# Patient Record
Sex: Female | Born: 1990 | Hispanic: No | Marital: Single | Smoking: Current every day smoker
Health system: Southern US, Community
[De-identification: ages and names within clinical notes are randomized; demographics above are authoritative.]

## PROBLEM LIST (undated history)

## (undated) ENCOUNTER — Emergency Department
Admission: EM | Disposition: A | Discharge status: Still In Hospital | Payer: No Typology Code available for payment source

## (undated) ENCOUNTER — Emergency Department: Admission: EM | Discharge status: Absent without leave | Payer: No Typology Code available for payment source

---
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
## 2013-03-27 ENCOUNTER — Encounter (HOSPITAL_COMMUNITY): Payer: Self-pay | Admitting: Cardiology

## 2013-03-27 ENCOUNTER — Emergency Department (HOSPITAL_COMMUNITY): Payer: Self-pay

## 2013-03-27 ENCOUNTER — Emergency Department (HOSPITAL_COMMUNITY)
Admission: EM | Admit: 2013-03-27 | Discharge: 2013-03-28 | Disposition: A | Discharge status: Home / Self Care | Payer: Self-pay | Attending: Emergency Medicine | Admitting: Emergency Medicine

## 2013-03-27 DIAGNOSIS — Z79899 Other long term (current) drug therapy: Secondary | ICD-10-CM | POA: Insufficient documentation

## 2013-03-27 DIAGNOSIS — K8020 Calculus of gallbladder without cholecystitis without obstruction: Secondary | ICD-10-CM | POA: Insufficient documentation

## 2013-03-27 DIAGNOSIS — R1012 Left upper quadrant pain: Secondary | ICD-10-CM | POA: Insufficient documentation

## 2013-03-27 DIAGNOSIS — Z3202 Encounter for pregnancy test, result negative: Secondary | ICD-10-CM | POA: Insufficient documentation

## 2013-03-27 DIAGNOSIS — R339 Retention of urine, unspecified: Secondary | ICD-10-CM | POA: Insufficient documentation

## 2013-03-27 DIAGNOSIS — R1011 Right upper quadrant pain: Secondary | ICD-10-CM | POA: Insufficient documentation

## 2013-03-27 DIAGNOSIS — R109 Unspecified abdominal pain: Secondary | ICD-10-CM

## 2013-03-27 DIAGNOSIS — F17200 Nicotine dependence, unspecified, uncomplicated: Secondary | ICD-10-CM | POA: Insufficient documentation

## 2013-03-27 LAB — URINALYSIS, ROUTINE W REFLEX MICROSCOPIC
Glucose, UA: NEGATIVE mg/dL
Ketones, ur: NEGATIVE mg/dL
Leukocytes, UA: NEGATIVE
Nitrite: NEGATIVE
Protein, ur: NEGATIVE mg/dL
pH: 7.5 (ref 5.0–8.0)

## 2013-03-27 LAB — CBC WITH DIFFERENTIAL/PLATELET
Eosinophils Relative: 2 % (ref 0–5)
Hemoglobin: 13 g/dL (ref 12.0–15.0)
Lymphs Abs: 1.7 10*3/uL (ref 0.7–4.0)
MCH: 28.6 pg (ref 26.0–34.0)
MCHC: 33.1 g/dL (ref 30.0–36.0)
MCV: 86.4 fL (ref 78.0–100.0)
Neutrophils Relative %: 76 % (ref 43–77)
RBC: 4.55 MIL/uL (ref 3.87–5.11)
RDW: 13.2 % (ref 11.5–15.5)
WBC: 11.9 10*3/uL — ABNORMAL HIGH (ref 4.0–10.5)

## 2013-03-27 LAB — COMPREHENSIVE METABOLIC PANEL
AST: 163 U/L — ABNORMAL HIGH (ref 0–37)
Albumin: 3.9 g/dL (ref 3.5–5.2)
BUN: 9 mg/dL (ref 6–23)
Creatinine, Ser: 0.75 mg/dL (ref 0.50–1.10)
GFR calc non Af Amer: >90 mL/min (ref >90)
Glucose, Bld: 98 mg/dL (ref 70–99)

## 2013-03-27 LAB — POCT PREGNANCY, URINE: Preg Test, Ur: NEGATIVE

## 2013-03-27 NOTE — ED Provider Notes (Addendum)
CSN: [id number]     Arrival date & time 03/27/13  1635 History   First MD Initiated Contact with Patient 03/27/13 1935     Chief Complaint  Patient presents with  . Abdominal Pain   (Consider location/radiation/quality/duration/timing/severity/associated sxs/prior Treatment) Patient is a 22 y.o. female presenting with abdominal pain. The history is provided by the patient.  Abdominal Pain Pain location:  LUQ and RUQ Pain quality: aching, bloating and dull   Pain quality: not cramping, not heavy and no pressure   Pain radiates to:  Does not radiate Pain severity:  Moderate Onset quality:  Gradual Duration:  2 days Timing:  Constant Progression:  Worsening Chronicity:  New Context: alcohol use   Context comment:  Remote history of excessive alcohol use. Patient stopped drinking heavily approximately 2 months ago Relieved by:  Nothing Worsened by:  Nothing tried Ineffective treatments:  Acetaminophen Associated symptoms: no anorexia, no belching, no chest pain, no chills, no constipation, no cough, no diarrhea, no dysuria, no hematuria, no nausea, no sore throat, no vaginal bleeding and no vaginal discharge   Risk factors: alcohol abuse   Risk factors comment:  Alcohol abuse in the past, minimal drinking over past 2 months   History reviewed. No pertinent past medical history. History reviewed. No pertinent past surgical history. History reviewed. No pertinent family history. History  Substance Use Topics  . Smoking status: Current Every Day Smoker    Types: Cigarettes  . Smokeless tobacco: Not on file  . Alcohol Use: Yes   OB History   Grav Para Term Preterm Abortions TAB SAB Ect Mult Living                 Review of Systems  Constitutional: Negative for chills, diaphoresis, activity change and appetite change.  HENT: Negative.  Negative for sore throat.   Eyes: Negative.   Respiratory: Negative.  Negative for cough.   Cardiovascular: Negative for chest pain.   Gastrointestinal: Positive for abdominal pain. Negative for nausea, diarrhea, constipation, blood in stool, abdominal distention, anal bleeding and anorexia.  Endocrine: Negative.   Genitourinary: Positive for decreased urine volume. Negative for dysuria, hematuria, vaginal bleeding, vaginal discharge, difficulty urinating, menstrual problem and dyspareunia.  Musculoskeletal: Negative.   Neurological: Negative.   Psychiatric/Behavioral: Negative.     Allergies  Review of patient's allergies indicates no known allergies.  Home Medications   Current Outpatient Rx  Name  Route  Sig  Dispense  Refill  . naproxen sodium (ANAPROX) 220 MG tablet   Oral   Take 220 mg by mouth 2 (two) times daily with a meal.          BP 104/66  Pulse 58  Temp(Src) 98.1 F (36.7 C) (Oral)  Resp 18  SpO2 99%  LMP 02/25/2013 Physical Exam  Constitutional: She is oriented to person, place, and time. She appears well-developed and well-nourished.  HENT:  Head: Normocephalic and atraumatic.  Eyes: Conjunctivae and EOM are normal. Pupils are equal, round, and reactive to light.  Neck: Normal range of motion.  Cardiovascular: Normal rate and regular rhythm.  Exam reveals no gallop and no friction rub.   No murmur heard. Pulmonary/Chest: Effort normal.  Abdominal: Soft. Bowel sounds are normal. She exhibits no distension and no mass. There is tenderness. There is no rebound and no guarding.  Genitourinary:  Patient denies lower abdominal pain, vaginal symptoms, declines pelvic exam.  Musculoskeletal: Normal range of motion.  Neurological: She is alert and oriented to person, place, and  time. She has normal reflexes.  Skin: Skin is warm and dry.    ED Course  Procedures (including critical care time) Labs Review Labs Reviewed  CBC WITH DIFFERENTIAL - Abnormal; Notable for the following:    WBC 11.9 (*)    Neutro Abs 9.0 (*)    All other components within normal limits  COMPREHENSIVE METABOLIC  PANEL - Abnormal; Notable for the following:    AST 163 (*)    All other components within normal limits  LIPASE, BLOOD  URINALYSIS, ROUTINE W REFLEX MICROSCOPIC  POCT PREGNANCY, URINE   Results for orders placed during the hospital encounter of 03/27/13  CBC WITH DIFFERENTIAL      Result Value Range   WBC 11.9 (*) 4.0 - 10.5 K/uL   RBC 4.55  3.87 - 5.11 MIL/uL   Hemoglobin 13.0  12.0 - 15.0 g/dL   HCT 16.1  09.6 - 04.5 %   MCV 86.4  78.0 - 100.0 fL   MCH 28.6  26.0 - 34.0 pg   MCHC 33.1  30.0 - 36.0 g/dL   RDW 40.9  81.1 - 91.4 %   Platelets 274  150 - 400 K/uL   Neutrophils Relative % 76  43 - 77 %   Neutro Abs 9.0 (*) 1.7 - 7.7 K/uL   Lymphocytes Relative 14  12 - 46 %   Lymphs Abs 1.7  0.7 - 4.0 K/uL   Monocytes Relative 8  3 - 12 %   Monocytes Absolute 1.0  0.1 - 1.0 K/uL   Eosinophils Relative 2  0 - 5 %   Eosinophils Absolute 0.2  0.0 - 0.7 K/uL   Basophils Relative 0  0 - 1 %   Basophils Absolute 0.0  0.0 - 0.1 K/uL  COMPREHENSIVE METABOLIC PANEL      Result Value Range   Sodium 139  135 - 145 mEq/L   Potassium 3.5  3.5 - 5.1 mEq/L   Chloride 102  96 - 112 mEq/L   CO2 26  19 - 32 mEq/L   Glucose, Bld 98  70 - 99 mg/dL   BUN 9  6 - 23 mg/dL   Creatinine, Ser 7.82  0.50 - 1.10 mg/dL   Calcium 9.1  8.4 - 95.6 mg/dL   Total Protein 7.6  6.0 - 8.3 g/dL   Albumin 3.9  3.5 - 5.2 g/dL   AST 213 (*) 0 - 37 U/L   ALT 35  0 - 35 U/L   Alkaline Phosphatase 99  39 - 117 U/L   Total Bilirubin 0.6  0.3 - 1.2 mg/dL   GFR calc non Af Amer >90  >90 mL/min   GFR calc Af Amer >90  >90 mL/min  LIPASE, BLOOD      Result Value Range   Lipase 27  11 - 59 U/L  URINALYSIS, ROUTINE W REFLEX MICROSCOPIC      Result Value Range   Color, Urine YELLOW  YELLOW   APPearance CLEAR  CLEAR   Specific Gravity, Urine 1.008  1.005 - 1.030   pH 7.5  5.0 - 8.0   Glucose, UA NEGATIVE  NEGATIVE mg/dL   Hgb urine dipstick NEGATIVE  NEGATIVE   Bilirubin Urine NEGATIVE  NEGATIVE   Ketones, ur  NEGATIVE  NEGATIVE mg/dL   Protein, ur NEGATIVE  NEGATIVE mg/dL   Urobilinogen, UA 1.0  0.0 - 1.0 mg/dL   Nitrite NEGATIVE  NEGATIVE   Leukocytes, UA NEGATIVE  NEGATIVE  POCT PREGNANCY, URINE  Result Value Range   Preg Test, Ur NEGATIVE  NEGATIVE     Imaging Review US Abdomen Complete  03/27/2013   *RADIOLOGY REPORT*  Clinical Data:  Abdominal right upper quadrant pain and increased liver function test.  COMPLETE ABDOMINAL ULTRASOUND  Comparison:  None.  Findings:  Gallbladder:  Numerous large, mobile gallstones.  No wall thickening, sonographic Murphy's sign, or pericholecystic edema.  Common bile duct:  Measures 5 mm.  Liver:  No focal lesion identified.  Within normal limits in parenchymal echogenicity.  IVC:  Appears normal.  Pancreas:  No focal abnormality seen.  Spleen:  Measures 6 cm.  Right Kidney:  Measures 9.2 cm.  No hydronephrosis or focal abnormality.  Left Kidney:  Measures 9.9 cm.  No hydronephrosis or focal abnormality.  Abdominal aorta:  Negative for aneurysm.  Maximal diameter 1.8 cm.  IMPRESSION: Cholelithiasis without evidence of acute cholecystitis.   Original Report Authenticated By: Tiburcio Pea    MDM  No diagnosis found. 22 year old female presents with bilateral upper quadrant abdominal pain, nausea, elevated AST. We'll obtain a quadrant ultrasound to assess for hepatic disease and/or cholecystitis. VSS, AF in nad.    12:06 AM BP 104/66  Pulse 58  Temp(Src) 98.1 F (36.7 C) (Oral)  Resp 18  SpO2 99%  LMP 02/25/2013 Vital signs normal, patient afebrile, abdomen nonsurgical, ultrasound consistent with cholelithiasis without evidence of cholecystitis. White blood cell count malleoli 11.9 however no evidence of serious bacterial illness, surgical abdomen. Plan to discharge with pain medications and patient will followup as outpatient. Emergency department return precautions were provided for her fever, by mouth intolerance, worsening pain, or other  concerns.    Darlys Gales, MD 03/27/13 2119  Darlys Gales, MD 03/28/13 [phone]

## 2013-03-27 NOTE — ED Notes (Signed)
Pt reports lower abd pain that started about 30 minutes ago. Denies any n/v or urinary symptoms with the pain. States that she feels like she has been punched in the stomach.

## 2013-03-27 NOTE — ED Notes (Signed)
Patient back from [facility]

## 2013-03-28 MED ORDER — ONDANSETRON HCL 4 MG PO TABS: 4.0000 mg | ORAL_TABLET | Freq: Four times a day (QID) | ORAL | Status: DC

## 2013-03-28 MED ORDER — DICYCLOMINE HCL 20 MG PO TABS: 20.0000 mg | ORAL_TABLET | Freq: Two times a day (BID) | ORAL | Status: DC

## 2013-12-14 ENCOUNTER — Encounter (HOSPITAL_COMMUNITY): Payer: Self-pay | Admitting: Emergency Medicine

## 2013-12-14 ENCOUNTER — Emergency Department (HOSPITAL_COMMUNITY)
Admission: EM | Admit: 2013-12-14 | Discharge: 2013-12-14 | Disposition: A | Discharge status: Home / Self Care | Payer: No Typology Code available for payment source | Attending: Emergency Medicine | Admitting: Emergency Medicine

## 2013-12-14 ENCOUNTER — Emergency Department (HOSPITAL_COMMUNITY): Payer: No Typology Code available for payment source

## 2013-12-14 DIAGNOSIS — Z791 Long term (current) use of non-steroidal anti-inflammatories (NSAID): Secondary | ICD-10-CM | POA: Insufficient documentation

## 2013-12-14 DIAGNOSIS — S298XXA Other specified injuries of thorax, initial encounter: Secondary | ICD-10-CM | POA: Insufficient documentation

## 2013-12-14 DIAGNOSIS — S6990XA Unspecified injury of unspecified wrist, hand and finger(s), initial encounter: Secondary | ICD-10-CM

## 2013-12-14 DIAGNOSIS — Y9389 Activity, other specified: Secondary | ICD-10-CM | POA: Insufficient documentation

## 2013-12-14 DIAGNOSIS — Y92410 Unspecified street and highway as the place of occurrence of the external cause: Secondary | ICD-10-CM | POA: Insufficient documentation

## 2013-12-14 DIAGNOSIS — M25531 Pain in right wrist: Secondary | ICD-10-CM

## 2013-12-14 DIAGNOSIS — IMO0002 Reserved for concepts with insufficient information to code with codable children: Secondary | ICD-10-CM | POA: Insufficient documentation

## 2013-12-14 DIAGNOSIS — S59919A Unspecified injury of unspecified forearm, initial encounter: Secondary | ICD-10-CM

## 2013-12-14 DIAGNOSIS — R079 Chest pain, unspecified: Secondary | ICD-10-CM

## 2013-12-14 DIAGNOSIS — F17200 Nicotine dependence, unspecified, uncomplicated: Secondary | ICD-10-CM | POA: Insufficient documentation

## 2013-12-14 DIAGNOSIS — Z79899 Other long term (current) drug therapy: Secondary | ICD-10-CM | POA: Insufficient documentation

## 2013-12-14 DIAGNOSIS — S60219A Contusion of unspecified wrist, initial encounter: Secondary | ICD-10-CM | POA: Insufficient documentation

## 2013-12-14 DIAGNOSIS — S20219A Contusion of unspecified front wall of thorax, initial encounter: Secondary | ICD-10-CM | POA: Insufficient documentation

## 2013-12-14 DIAGNOSIS — S59909A Unspecified injury of unspecified elbow, initial encounter: Secondary | ICD-10-CM | POA: Insufficient documentation

## 2013-12-14 MED ORDER — OXYCODONE-ACETAMINOPHEN 5-325 MG PO TABS: 1.0000 | ORAL_TABLET | ORAL | Status: DC | PRN

## 2013-12-14 MED ORDER — IBUPROFEN 800 MG PO TABS: 800.0000 mg | ORAL_TABLET | Freq: Three times a day (TID) | ORAL | Status: DC

## 2013-12-14 MED ORDER — OXYCODONE-ACETAMINOPHEN 5-325 MG PO TABS
2.0000 | ORAL_TABLET | Freq: Once | ORAL | Status: AC
  Administered 2013-12-14: 2 via ORAL
  Filled 2013-12-14: qty 2
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
## 2013-12-14 NOTE — Discharge Instructions (Signed)
Take the prescribed medication as directed for pain.  May also wish to ice and elevate wrist to help with pain/swelling. You will likely continue to be sore for the next several days but it should gradually start improving over time. Return to the ED for new or worsening symptoms.

## 2013-12-15 NOTE — ED Provider Notes (Signed)
Medical screening examination/treatment/procedure(s) were performed by non-physician practitioner and as supervising physician I was immediately available for consultation/collaboration.   EKG Interpretation None        Delmon Andrada M Coley Littles, MD 12/15/13 0017 

## 2017-08-02 ENCOUNTER — Ambulatory Visit
Admission: RE | Admit: 2017-08-02 | Discharge: 2017-08-02 | Disposition: A | Discharge status: Home / Self Care | Payer: Self-pay | Attending: Family Medicine | Admitting: Family Medicine
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
## 2018-02-16 ENCOUNTER — Encounter (HOSPITAL_COMMUNITY): Payer: Self-pay | Admitting: Emergency Medicine

## 2018-02-16 ENCOUNTER — Emergency Department (HOSPITAL_COMMUNITY)
Admission: EM | Admit: 2018-02-16 | Discharge: 2018-02-16 | Discharge status: Against Medical Advice | Payer: Self-pay | Attending: Emergency Medicine | Admitting: Emergency Medicine

## 2018-02-16 DIAGNOSIS — T40601A Poisoning by unspecified narcotics, accidental (unintentional), initial encounter: Secondary | ICD-10-CM | POA: Insufficient documentation

## 2018-02-16 DIAGNOSIS — R404 Transient alteration of awareness: Secondary | ICD-10-CM | POA: Insufficient documentation

## 2018-02-16 DIAGNOSIS — F17210 Nicotine dependence, cigarettes, uncomplicated: Secondary | ICD-10-CM | POA: Insufficient documentation

## 2018-02-16 DIAGNOSIS — Z79899 Other long term (current) drug therapy: Secondary | ICD-10-CM | POA: Insufficient documentation

## 2018-02-16 LAB — CBC WITH DIFFERENTIAL/PLATELET
Abs Immature Granulocytes: 0 10*3/uL (ref 0.0–0.1)
BASOS PCT: 1 %
Basophils Absolute: 0.1 10*3/uL (ref 0.0–0.1)
EOS ABS: 0.6 10*3/uL (ref 0.0–0.7)
EOS PCT: 6 %
HCT: 38.1 % (ref 36.0–46.0)
HEMOGLOBIN: 11.7 g/dL — AB (ref 12.0–15.0)
Immature Granulocytes: 0 %
LYMPHS PCT: 37 %
Lymphs Abs: 3.3 10*3/uL (ref 0.7–4.0)
MCH: 25.8 pg — AB (ref 26.0–34.0)
MCHC: 30.7 g/dL (ref 30.0–36.0)
MCV: 84.1 fL (ref 78.0–100.0)
MONO ABS: 0.9 10*3/uL (ref 0.1–1.0)
Monocytes Relative: 10 %
Neutro Abs: 4 10*3/uL (ref 1.7–7.7)
Neutrophils Relative %: 46 %
PLATELETS: 328 10*3/uL (ref 150–400)
RBC: 4.53 MIL/uL (ref 3.87–5.11)
RDW: 17.2 % — AB (ref 11.5–15.5)
WBC: 8.9 10*3/uL (ref 4.0–10.5)

## 2018-02-16 LAB — RAPID URINE DRUG SCREEN, HOSP PERFORMED
AMPHETAMINES: NOT DETECTED
Barbiturates: NOT DETECTED
Benzodiazepines: NOT DETECTED
Cocaine: NOT DETECTED
OPIATES: NOT DETECTED
Tetrahydrocannabinol: NOT DETECTED

## 2018-02-16 LAB — COMPREHENSIVE METABOLIC PANEL
ALK PHOS: 58 U/L (ref 38–126)
ALT: 12 U/L (ref 0–44)
AST: 51 U/L — AB (ref 15–41)
Albumin: 3.9 g/dL (ref 3.5–5.0)
Anion gap: 9 (ref 5–15)
BILIRUBIN TOTAL: 0.6 mg/dL (ref 0.3–1.2)
BUN: 8 mg/dL (ref 6–20)
CALCIUM: 8.5 mg/dL — AB (ref 8.9–10.3)
CO2: 26 mmol/L (ref 22–32)
Chloride: 103 mmol/L (ref 98–111)
Creatinine, Ser: 0.99 mg/dL (ref 0.44–1.00)
GFR calc Af Amer: >60 mL/min (ref >60)
GLUCOSE: 83 mg/dL (ref 70–99)
Potassium: 4.5 mmol/L (ref 3.5–5.1)
Sodium: 138 mmol/L (ref 135–145)
TOTAL PROTEIN: 6.5 g/dL (ref 6.5–8.1)

## 2018-02-16 LAB — I-STAT BETA HCG BLOOD, ED (MC, WL, AP ONLY)

## 2018-02-16 LAB — MAGNESIUM: MAGNESIUM: 2.1 mg/dL (ref 1.7–2.4)

## 2018-02-16 LAB — ACETAMINOPHEN LEVEL

## 2018-02-16 LAB — ETHANOL: ALCOHOL ETHYL (B): 88 mg/dL — AB (ref <10)

## 2018-02-16 LAB — SALICYLATE LEVEL

## 2018-02-16 NOTE — ED Notes (Signed)
Dr. Preston FleetingGlick spoke with pt about risks of leaving AMA, pt verbalized understanding.
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
## 2018-02-16 NOTE — ED Notes (Signed)
Pt A&O x 4 at this time, refused final set of vitals.

## 2018-02-16 NOTE — ED Notes (Signed)
Pt requesting to leave, Dr. Preston FleetingGlick aware.
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
## 2018-02-16 NOTE — Discharge Instructions (Addendum)
You are leaving against medical advice.  You had an overdose of an opioid, and received an antidote for opioid overdose.  However, that antidote does not last for very long.  If the opioid you overdosed on it is still in your system when the antidote wears off, you can stop breathing.  This can cause death or irreversible brain damage.  If you change your mind about staying in the emergency department, you are welcome to return at any time.

## 2018-07-12 ENCOUNTER — Emergency Department (HOSPITAL_COMMUNITY): Payer: Self-pay

## 2018-07-12 ENCOUNTER — Inpatient Hospital Stay (HOSPITAL_COMMUNITY)
Admission: EM | Admit: 2018-07-12 | Discharge: 2018-07-26 | DRG: 091 | Disposition: E | Payer: Self-pay | Attending: Internal Medicine | Admitting: Internal Medicine

## 2018-07-12 DIAGNOSIS — I469 Cardiac arrest, cause unspecified: Secondary | ICD-10-CM

## 2018-07-12 DIAGNOSIS — R578 Other shock: Secondary | ICD-10-CM | POA: Diagnosis present

## 2018-07-12 DIAGNOSIS — F17210 Nicotine dependence, cigarettes, uncomplicated: Secondary | ICD-10-CM | POA: Diagnosis present

## 2018-07-12 DIAGNOSIS — K7200 Acute and subacute hepatic failure without coma: Secondary | ICD-10-CM | POA: Diagnosis present

## 2018-07-12 DIAGNOSIS — T40601A Poisoning by unspecified narcotics, accidental (unintentional), initial encounter: Secondary | ICD-10-CM | POA: Diagnosis present

## 2018-07-12 DIAGNOSIS — E875 Hyperkalemia: Secondary | ICD-10-CM | POA: Diagnosis present

## 2018-07-12 DIAGNOSIS — Z79891 Long term (current) use of opiate analgesic: Secondary | ICD-10-CM

## 2018-07-12 DIAGNOSIS — E872 Acidosis: Secondary | ICD-10-CM | POA: Diagnosis present

## 2018-07-12 DIAGNOSIS — Z791 Long term (current) use of non-steroidal anti-inflammatories (NSAID): Secondary | ICD-10-CM

## 2018-07-12 DIAGNOSIS — J9600 Acute respiratory failure, unspecified whether with hypoxia or hypercapnia: Secondary | ICD-10-CM

## 2018-07-12 DIAGNOSIS — G931 Anoxic brain damage, not elsewhere classified: Principal | ICD-10-CM

## 2018-07-12 LAB — CBC WITH DIFFERENTIAL/PLATELET
Abs Immature Granulocytes: 1.2 10*3/uL — ABNORMAL HIGH (ref 0.00–0.07)
BASOS ABS: 0.1 10*3/uL (ref 0.0–0.1)
BASOS PCT: 1 %
Band Neutrophils: 1 %
EOS ABS: 0.3 10*3/uL (ref 0.0–0.5)
EOS PCT: 2 %
HCT: 45.4 % (ref 36.0–46.0)
HEMOGLOBIN: 12.3 g/dL (ref 12.0–15.0)
Lymphocytes Relative: 54 %
Lymphs Abs: 7 10*3/uL — ABNORMAL HIGH (ref 0.7–4.0)
MCH: 27.6 pg (ref 26.0–34.0)
MCHC: 27.1 g/dL — AB (ref 30.0–36.0)
MCV: 102 fL — AB (ref 80.0–100.0)
Metamyelocytes Relative: 4 %
Monocytes Absolute: 0.8 10*3/uL (ref 0.1–1.0)
Monocytes Relative: 6 %
Myelocytes: 5 %
NEUTROS ABS: 3.6 10*3/uL (ref 1.7–7.7)
NEUTROS PCT: 27 %
NRBC: 0.4 % — AB (ref 0.0–0.2)
Platelets: 175 10*3/uL (ref 150–400)
RBC: 4.45 MIL/uL (ref 3.87–5.11)
RDW: 16.7 % — ABNORMAL HIGH (ref 11.5–15.5)
WBC: 13 10*3/uL — ABNORMAL HIGH (ref 4.0–10.5)
nRBC: 1 /100 WBC — ABNORMAL HIGH

## 2018-07-12 LAB — COMPREHENSIVE METABOLIC PANEL
ALBUMIN: 2.8 g/dL — AB (ref 3.5–5.0)
ALK PHOS: 92 U/L (ref 38–126)
ALT: 390 U/L — ABNORMAL HIGH (ref 0–44)
ANION GAP: 26 — AB (ref 5–15)
AST: 877 U/L — ABNORMAL HIGH (ref 15–41)
BUN: 15 mg/dL (ref 6–20)
CALCIUM: 8.4 mg/dL — AB (ref 8.9–10.3)
CO2: 7 mmol/L — ABNORMAL LOW (ref 22–32)
Chloride: 105 mmol/L (ref 98–111)
Creatinine, Ser: 1.87 mg/dL — ABNORMAL HIGH (ref 0.44–1.00)
GFR calc Af Amer: 42 mL/min — ABNORMAL LOW (ref >60)
GFR calc non Af Amer: 36 mL/min — ABNORMAL LOW (ref >60)
GLUCOSE: 541 mg/dL — AB (ref 70–99)
Potassium: 5.2 mmol/L — ABNORMAL HIGH (ref 3.5–5.1)
Sodium: 138 mmol/L (ref 135–145)
TOTAL PROTEIN: 5.4 g/dL — AB (ref 6.5–8.1)
Total Bilirubin: 0.7 mg/dL (ref 0.3–1.2)

## 2018-07-12 LAB — I-STAT ARTERIAL BLOOD GAS, ED
Acid-base deficit: 9 mmol/L — ABNORMAL HIGH (ref 0.0–2.0)
Bicarbonate: 21.1 mmol/L (ref 20.0–28.0)
O2 Saturation: 70 %
PCO2 ART: 56.7 mmHg — AB (ref 32.0–48.0)
PO2 ART: 36 mmHg — AB (ref 83.0–108.0)
Patient temperature: 89.9
TCO2: 23 mmol/L (ref 22–32)
pH, Arterial: 7.149 — CL (ref 7.350–7.450)

## 2018-07-12 LAB — MAGNESIUM: Magnesium: 3.5 mg/dL — ABNORMAL HIGH (ref 1.7–2.4)

## 2018-07-12 LAB — TROPONIN I: Troponin I: 0.29 ng/mL (ref <0.03)

## 2018-07-12 MED ORDER — NOREPINEPHRINE 4 MG/250ML-% IV SOLN
INTRAVENOUS | Status: AC
  Administered 2018-07-12: 4 mg
  Filled 2018-07-12: qty 250

## 2018-07-12 MED ORDER — ATROPINE SULFATE 1 MG/ML IJ SOLN
INTRAMUSCULAR | Status: AC | PRN
  Administered 2018-07-12: 1 mg via INTRAVENOUS

## 2018-07-12 MED ORDER — NOREPINEPHRINE 4 MG/250ML-% IV SOLN
0.0000 ug/min | INTRAVENOUS | Status: DC
Start: 2018-07-12 — End: 2018-07-12
  Filled 2018-07-12: qty 250

## 2018-07-12 MED ORDER — SODIUM BICARBONATE 8.4 % IV SOLN
INTRAVENOUS | Status: AC | PRN
  Administered 2018-07-12: 50 meq via INTRAVENOUS

## 2018-07-12 MED ORDER — SODIUM BICARBONATE 8.4 % IV SOLN
INTRAVENOUS | Status: DC
  Administered 2018-07-12: 09:00:00 via INTRAVENOUS
  Filled 2018-07-12 (×2): qty 150

## 2018-07-12 MED ORDER — HEPARIN SODIUM (PORCINE) 5000 UNIT/ML IJ SOLN: 5000.0000 [IU] | Freq: Three times a day (TID) | INTRAMUSCULAR | Status: DC

## 2018-07-12 MED ORDER — LACTATED RINGERS IV BOLUS
2000.0000 mL | Freq: Once | INTRAVENOUS | Status: AC
  Administered 2018-07-12: 2000 mL via INTRAVENOUS

## 2018-07-12 MED ORDER — STERILE WATER FOR INJECTION IV SOLN
INTRAVENOUS | Status: DC
  Filled 2018-07-12 (×4): qty 850

## 2018-07-12 MED ORDER — PHENYLEPHRINE HCL-NACL 10-0.9 MG/250ML-% IV SOLN
0.0000 ug/min | INTRAVENOUS | Status: DC
  Filled 2018-07-12: qty 250

## 2018-07-12 MED ORDER — EPINEPHRINE PF 1 MG/10ML IJ SOSY
PREFILLED_SYRINGE | INTRAMUSCULAR | Status: AC | PRN
  Administered 2018-07-12: 1 mg via INTRAVENOUS

## 2018-07-12 MED ORDER — SODIUM CHLORIDE 0.9 % IV SOLN
Freq: Once | INTRAVENOUS | Status: AC
  Administered 2018-07-12: 1000 mL via INTRAVENOUS

## 2018-07-12 MED ORDER — PANTOPRAZOLE SODIUM 40 MG IV SOLR: 40.0000 mg | Freq: Every day | INTRAVENOUS | Status: DC

## 2018-07-12 MED ORDER — EPINEPHRINE PF 1 MG/ML IJ SOLN
0.5000 ug/min | INTRAVENOUS | Status: DC
  Administered 2018-07-12: 10 ug/min via INTRAVENOUS
  Filled 2018-07-12: qty 4

## 2018-07-12 MED ORDER — STERILE WATER FOR INJECTION IV SOLN
INTRAVENOUS | Status: DC
  Filled 2018-07-12: qty 850

## 2018-07-12 MED FILL — Medication: Qty: 1 | Status: AC

## 2018-07-17 LAB — CULTURE, BLOOD (ROUTINE X 2)
Culture: NO GROWTH
Special Requests: ADEQUATE

## 2018-07-26 NOTE — ED Provider Notes (Signed)
Emergency Department Provider Note   I have reviewed the triage vital signs and the nursing notes.   HISTORY  Chief Complaint Cardiac Arrest   HPI Emily Flores is a 28 y.o. female who presents the emergency department with active CPR not able to offer any history.  Sounds like from EMS that the patient came home around 2:00 and was seen ambulate without difficulty but then around 430 was found pulseless and in respiratory cardiac arrest.  On fire arrival they gave her Narcan she has a history of opiate overuse but on EMS arrival she was still in asystolic arrest.  Multiple doses of epinephrine given over a 30-minute time span and she got ROSC.  Patient lost pulses again approximately 30 minutes later.  Her end-tidal CO2 was in the 60s but then dropped.  She arrives in active CPR after 2 more doses of epinephrine.   LEVEL V CAVEAT APPLIES SECONDARY TO active CPR   No past medical history on file.  There are no active problems to display for this patient.   No past surgical history on file.  Current Outpatient Rx  . Order #: [id number] Class: Historical Med  . Order #: [id number] Class: Print  . Order #: [id number] Class: Print    Allergies Patient has no known allergies.  No family history on file.  Social History Social History   Tobacco Use  . Smoking status: Current Every Day Smoker    Types: Cigarettes  . Smokeless tobacco: Never Used  Substance Use Topics  . Alcohol use: Yes  . Drug use: Yes    Types: Marijuana    Review of Systems  LEVEL V CAVEAT APPLIES SECONDARY TO active CPR ____________________________________________  PHYSICAL EXAM:  VITAL SIGNS: ED Triage Vitals  Enc Vitals Group     BP 06-09-2018 0646 (!) 0/0     Pulse Rate 06-09-2018 0646 (!) 0     Resp --      Temp --      Temp src --      SpO2 06-09-2018 0646 97 %     Weight 06-09-2018 0721 130 lb (59 kg)     Height 06-09-2018 0721 5\' 6"  (1.676 m)    Constitutional: Well appearing. Well  nourished. Eyes: Conjunctivae are normal. Blown nonreactive Pupils.  Head: Atraumatic. Nose: No congestion/rhinnorhea. Mouth/Throat: Mucous membranes are not able to be assessed.  Oropharynx non-erythematous. Neck: No stridor.  No meningeal signs.   Cardiovascular: No rate, No rhythm.  Respiratory: No respiratory effort.  Lungs crackles, rhonchi Gastrointestinal: Soft. Mild distention.  Musculoskeletal: No lower extremity tenderness nor edema. No gross deformities of extremities. Neurologic:  Not able to assess 2/2 condition.  Skin:   No rash noted. Psych: Not able to assess 2/2 condition.   ____________________________________________   LABS (all labs ordered are listed, but only abnormal results are displayed)  Labs Reviewed  CBC WITH DIFFERENTIAL/PLATELET  COMPREHENSIVE METABOLIC PANEL  LACTIC ACID, PLASMA  LACTIC ACID, PLASMA  TROPONIN I  MAGNESIUM  BLOOD GAS, ARTERIAL   ____________________________________________  EKG   EKG Interpretation  Date/Time:  Wednesday July 12 2018 06:55:11 EST Ventricular Rate:  88 PR Interval:    QRS Duration: 120 QT Interval:  353 QTC Calculation: 428 R Axis:   105 Text Interpretation:  Sinus rhythm Consider left atrial enlargement Nonspecific intraventricular conduction delay Repol abnrm suggests ischemia, diffuse leads worse than previous Confirmed by Marily MemosMesner, Kadajah Kjos [phone]([id number]) on July 26, 2018 7:38:30 AM       EKG Interpretation  Date/Time:  Wednesday 08/08/2018 07:06:22 EST Ventricular Rate:  78 PR Interval:    QRS Duration: 129 QT Interval:  482 QTC Calculation: 550 R Axis:   79 Text Interpretation:  Sinus rhythm Nonspecific intraventricular conduction delay Repol abnrm suggests ischemia, inferior leads improved diffuse changes from most recent Confirmed by Marily Memos [phone]) on 08-08-2018 7:39:48 AM      ____________________________________________  RADIOLOGY  Dg Chest Portable 1 View  Result Date:  2018/08/08 CLINICAL DATA:  Hypoxia EXAM: PORTABLE CHEST 1 VIEW COMPARISON:  Dec 14, 2013 FINDINGS: Endotracheal tube tip is 4.0 cm above the carina. No pneumothorax. There is airspace consolidation throughout much of the right lung. Left lung is clear. Heart size and pulmonary vascularity are normal. No adenopathy. No bone lesions. IMPRESSION: Endotracheal tube as described without pneumothorax. Consolidation throughout much of the right lung. Suspect diffuse pneumonia, although aspiration could present similarly. Left lung clear. Heart size within normal limits. Electronically Signed   By: Bretta Bang III M.D.   On: August 08, 2018 07:26   ____________________________________________   PROCEDURES  Procedure(s) performed:   Procedures     EMERGENCY DEPARTMENT [place] CARDIAC EXAM "Study: Limited Ultrasound of the heart and pericardium"  INDICATIONS:Cardiac arrest Multiple views of the heart and pericardium were obtained in real-time with a multi-frequency probe.  PERFORMED JX:BJYNWG  IMAGES ARCHIVED?: No  FINDINGS: No pericardial effusion and Hyperdynamic contractility  LIMITATIONS:  Body habitus and Emergent procedure  VIEWS USED: Subcostal 4 chamber and Apical 4 chamber   INTERPRETATION: Cardiac activity present and Increased contractility  CPT Code: 95621-30 (limited transthoracic cardiac)  CRITICAL CARE Performed by: Marily Memos Total critical care time: 35 minutes Critical care time was exclusive of separately billable procedures and treating other patients. Critical care was necessary to treat or prevent imminent or life-threatening deterioration. Critical care was time spent personally by me on the following activities: development of treatment plan with patient and/or surrogate as well as nursing, discussions with consultants, evaluation of patient's response to treatment, examination of patient, obtaining history from patient or surrogate, ordering and performing treatments  and interventions, ordering and review of laboratory studies, ordering and review of radiographic studies, pulse oximetry and re-evaluation of patient's condition.  Cardiopulmonary Resuscitation (CPR) Procedure Note Directed/Performed by: Marily Memos I personally directed ancillary staff and/or performed CPR in an effort to regain return of spontaneous circulation and to maintain cardiac, neuro and systemic perfusion.    ____________________________________________   INITIAL IMPRESSION / ASSESSMENT AND PLAN / ED COURSE  Pertinent labs & imaging results that were available during my care of the patient were reviewed by me and considered in my medical decision making (see chart for details).  EKGs with diffuse ischemia likely secondary to cardiac arrest improved with perfusion.  She is very cold in her extremities also likely patient seen associated with long downtime.  She has bilateral blown pupils the suspicion for has an anoxic brain injury.  She probably overdosed with opiates and respiratory depression led to current state. After prolonged resuscitation, ROSC achieved. Plan for PCCM consult for possible organ donation.  Care to Dr. Patria Mane pending their consult and further management.   ____________________________________________  FINAL CLINICAL IMPRESSION(S) / ED DIAGNOSES  Final diagnoses:  Acute respiratory failure, unspecified whether with hypoxia or hypercapnia (HCC)  Anoxic brain injury (HCC)    MEDICATIONS GIVEN DURING THIS VISIT:  Medications  lactated ringers bolus 2,000 mL (has no administration in time range)    NEW OUTPATIENT MEDICATIONS STARTED DURING THIS VISIT:  New Prescriptions   No medications on file    Note:  This document was prepared using Dragon voice recognition software and may include unintentional dictation errors.    Marily Memos, MD 07/14/18 [phone]

## 2018-07-26 NOTE — ED Provider Notes (Signed)
Brain death noted in the ER. Despite efforts to support her HR and BP for organ donation at the request of family, she underwent cardiac death at 1127am.  pts mother notified. Chaplain involved. Emotional support provided. Critical Care team updated. Pt will be a medical examiner case.    Emily Flores, Emily Mcneish, MD 10/25/17 1135

## 2018-07-26 NOTE — Progress Notes (Signed)
ABG results given to Dr Patria Maneampos. New order to increase RR.

## 2018-07-26 NOTE — Progress Notes (Signed)
Assist in transport pt to CT and back to ED on vent. No noted respiratory issues at this time.

## 2018-07-26 NOTE — Progress Notes (Signed)
Responded to on call chaplain referral to continue support to deceased patient's family. Supported family with empathetic listening, ministry of presence ,emotional and spiritual support.   Prayed over and for patient per family request. Facilitated information sharing between staff and family.  Patient is a ME Case. I Spoke directly to patient's mother and assisted her with what to do next.  I gave here Patient placement department  information with instructions on how to proceed.  Nurse Lew DawesKelly Moon  also spoke with patient's mother and reaffirmed information and intrstruction.  Support Staff throughout duration of caring. Staff appears ok. Will follow with staff at later hours.  Venida JarvisWatlington, Kale Rondeau, [place], [facility], Pager [phone]

## 2018-07-26 NOTE — ED Notes (Signed)
Family at bedside. 

## 2018-07-26 NOTE — Progress Notes (Signed)
Responded to page from ED. Patient is critically ill. Mother has been called. Will forward to up-coming chaplain. Provided the ministry of presence.

## 2018-07-26 NOTE — ED Notes (Signed)
Dr. Patria Maneampos at bedside unable to detected Cpr started  0805 am Epip x 1 0807 no pulse cpr started 0809 all efforts unsuccessful unable to feel pulse Dr. Patria Maneampos d/\c'd efforts.  [phone] Family at bedside, spoke with Dr. Patria Maneampos wishes to make patient a donor [phone] Pulse returned Dr. Patria Maneampos at bedside with u/s cardiac activity.  [facility] called # [phone] Molly Maduro(Robert ) Patient CBG [phone] HR 35 very faint pulse Epip x 1 Hco3 x 1 Calcium x 1 Rate v-tachy 0837 amiodarone 150 mg b/p 150/84 hr-92 sat 69% 0843 hr 25 Hco3x1 HOB elevated to 30 degree 0849 hr 35 epip x1  0851 HCO3 x 2 b/p 79/55 hr 74 sat 71

## 2018-07-26 NOTE — ED Notes (Signed)
Bicarb drip increased to 200 cc/hr./CC

## 2018-07-26 NOTE — ED Provider Notes (Addendum)
No corneal reflex. Pupils fixed and dilated. Suspect brain death with nonsurvivable brain injury. Family requesting organ donation. [place] [facility] contacted. Will add pressor support now for organ perfusion. Repeat transient CPR. Developed wide complex tachycardia. Responded to epi, bicarb, calcium. 150mg  amiodarone added once pulses returned. Additional bicarb now. Continued organ support.  CRITICAL CARE Performed by: Azalia BilisKevin Jumana Paccione Total critical care time: 45 minutes (does not include CPR) Critical care time was exclusive of separately billable procedures and treating other patients. Critical care was necessary to treat or prevent imminent or life-threatening deterioration. Critical care was time spent personally by me on the following activities: development of treatment plan with patient and/or surrogate as well as nursing, discussions with consultants, evaluation of patient's response to treatment, examination of patient, obtaining history from patient or surrogate, ordering and performing treatments and interventions, ordering and review of laboratory studies, ordering and review of radiographic studies, pulse oximetry and re-evaluation of patient's condition.    Cardiopulmonary Resuscitation (CPR) Procedure Note Directed/Performed by: Azalia BilisKevin Abbigal Radich I personally directed ancillary staff and/or performed CPR in an effort to regain return of spontaneous circulation and to maintain cardiac, neuro and systemic perfusion.     Azalia Bilisampos, Yue Flanigan, MD 2018/02/01 [phone]    Azalia Bilisampos, Elizabeth Paulsen, MD 07/14/18 [phone]

## 2018-07-26 NOTE — ED Notes (Signed)
Transported to CT 

## 2018-07-26 NOTE — ED Notes (Signed)
Peri care performed before and after cath insertion

## 2018-07-26 NOTE — ED Notes (Signed)
CC team at bedside , speaking with mother,

## 2018-07-26 NOTE — H&P (Signed)
NAMECarel Schnee Flores, MRN:  [id number], DOB:  10-Nov-1990, LOS: 0 ADMISSION DATE:  2018-07-15, CONSULTATION DATE:  12/18 REFERRING MD:  Dr. Patria Mane EDP, CHIEF COMPLAINT:  Cardiac arrest.    Brief History   28 year old female with unwitnessed prolonged cardiac arrest and signs of severe brain injury on admission.   History of present illness   28 year old female with PMH of substance abuse found down by her mother in the early AM hours of 07/15/2018. Last seen walking around about 3 hours prior, then found down around 5am. She was unresponsive face down on her bed. Mother called EMS and started CPR. Initial course of CPR lasted approximately 30 mins, and she had several subsequent arrests requiring CPR including after arrival to ED. Total downtime including all arrests was about 1.5 hours. Once pulses regained in the ED there were signs of significant brain injury including pupils fixed and dilated. CT scan of the head concerning for severe anoxic injury. Family was informed by EDP that this is not a survivable injury, but they wanted her to be considered for organ donation. She was in refractory shock and acidosis. PCCM was asked to admit.   Past Medical History    Significant Hospital Events   12/18: cardiac arrest  Consults:    Procedures:  ETT 12/18 >  Significant Diagnostic Tests:  CT head 12/18: indicative of diffuse intracranial edema with loss of sulci and compression of ventricles. Relative loss of Gray-white differentiation. Suspect anoxic injury.   Micro Data:    Antimicrobials:    Interim history/subjective:    Objective   Blood pressure (!) 61/46, pulse 81, temperature (!) 89.8 F (32.1 C), resp. rate (!) 28, height 5\' 6"  (1.676 m), weight 59 kg, SpO2 (!) 73 %.    Vent Mode: PRVC FiO2 (%):  [100 %] 100 % Set Rate:  [18 bmp-28 bmp] 28 bmp Vt Set:  [500 mL] 500 mL PEEP:  [5 cmH20-15 cmH20] 15 cmH20 Plateau Pressure:  [22 cmH20] 22 cmH20  No intake or output data  in the 24 hours ending 07/15/18 1044 Filed Weights   2018/07/15 0721  Weight: 59 kg    Examination: General: young adult female on vent HENT: Emily Flores/AT. Pupils fixed and dilated. No appreciable JVD Lungs: Clear bilateral breath sounds Cardiovascular: RRR, no MRG Abdomen: Soft, non-tender, non-distended Extremities: No acute deformity Neuro: Comatose. No corneal reflex. No pain response.   Resolved Hospital Problem list     Assessment & Plan:   Acute anoxic encephalopathy in the setting of cardiac arrest: No response on neuro exam. CT concerning for severe injury.  - Support BP and ventilation - Family in favor of organ donation if we can obtain some degree of hemodynamic stability.  - Continued neuro exam  Hypothermia - Temp foley - bair hugger - Warmed IV fluids - Respiratory seeing if we are able to use heated vent circuit.   Shock: neurogenic vs cardiogenic.  - IVF resuscitation: gentle. with warmed fluids.  - Epinephrine and norepinephrine infusions for MAP goal > 60 mm/Hg - Trend lactic acid - May need arterial line for better BP monitoring should she show [place] any signs of improvement.   Shock liver - BP support - Trend LFT  Hyperkalemia - bicarb infusion ongoing, follow BMP  Hyperglycemia with no history of DM - Change bicarb in dextrose to bicarb in sterile water - SSI  High AG acidosis: likely lactic - Assess lactic - Sodium bicarb ongoing.  Global: Family understands she has a poor prognosis. If she has truly suffered brain death, they are in favor or pursuing organ donation. Will need to pursue further brain death testing assuming she gains stability and it is still warranted.   UPDATE: I am now informed that the patient has expired in the emergency department.   Best practice:  Diet: NPO Pain/Anxiety/Delirium protocol (if indicated): NA VAP protocol (if indicated): per protocol DVT prophylaxis:SQ heparin  Labs   CBC: Recent Labs  Lab  01/03/18 0714  WBC 13.0*  NEUTROABS 3.6  HGB 12.3  HCT 45.4  MCV 102.0*  PLT 175    Basic Metabolic Panel: Recent Labs  Lab 01/03/18 0714  NA 138  K 5.2*  CL 105  CO2 7*  GLUCOSE 541*  BUN 15  CREATININE 1.87*  CALCIUM 8.4*  MG 3.5*   GFR: Estimated Creatinine Clearance: 42.1 mL/min (A) (by C-G formula based on SCr of 1.87 mg/dL (H)). Recent Labs  Lab 01/03/18 0714  WBC 13.0*    Liver Function Tests: Recent Labs  Lab 01/03/18 0714  AST 877*  ALT 390*  ALKPHOS 92  BILITOT 0.7  PROT 5.4*  ALBUMIN 2.8*   No results for input(s): LIPASE, AMYLASE in the last 168 hours. No results for input(s): AMMONIA in the last 168 hours.  ABG    Component Value Date/Time   PHART 7.149 (LL) 16-Dec-2017 0953   PCO2ART 56.7 (H) 16-Dec-2017 0953   PO2ART 36.0 (LL) 16-Dec-2017 0953   HCO3 21.1 16-Dec-2017 0953   TCO2 23 16-Dec-2017 0953   ACIDBASEDEF 9.0 (H) 16-Dec-2017 0953   O2SAT 70.0 16-Dec-2017 0953     Coagulation Profile: No results for input(s): INR, PROTIME in the last 168 hours.  Cardiac Enzymes: Recent Labs  Lab 01/03/18 0714  TROPONINI 0.29*    HbA1C: No results found for: HGBA1C  CBG: No results for input(s): GLUCAP in the last 168 hours.  Review of Systems:   unable  Past Medical History  She,  has no past medical history on file.   Surgical History   No past surgical history on file.   Social History   reports that she has been smoking cigarettes. She has never used smokeless tobacco. She reports current alcohol use. She reports current drug use. Drug: Marijuana.   Family History   Her family history is not on file.   Allergies No Known Allergies   Home Medications  Prior to Admission medications   Medication Sig Start Date End Date Taking? Authorizing Provider  ibuprofen (ADVIL,MOTRIN) 200 MG tablet Take 400 mg by mouth every 6 (six) hours as needed for headache.    [provider]  ibuprofen (ADVIL,MOTRIN) 800 MG tablet Take 1  tablet (800 mg total) by mouth 3 (three) times daily. 12/14/13   Garlon HatchetSanders, Lisa M, PA-C  oxyCODONE-acetaminophen (PERCOCET/ROXICET) 5-325 MG per tablet Take 1 tablet by mouth every 4 (four) hours as needed. 12/14/13   Garlon HatchetSanders, Lisa M, PA-C     Critical care time:      Joneen RoachPaul Tavon Magnussen, AGACNP-BC [facility] Pager [phone] or [phone]  16-Dec-2017 10:44 AM

## 2018-07-26 NOTE — ED Triage Notes (Addendum)
Patient presents to ED via [facility] patient was found by family unresponsive hx. Of heroin used ems states states per family patient was last seen at 2am. There was also a bag of syringes found beside her. Ems found patient pulseless apneiccpr was started at 0536 return of Rosc at 0601am enroute patient became pulseless at [phone] and CPR was started , arrived to ED on lucas, Patient pupils were fixed and dilated. Was given epip x 7 started on epip drip Patient was given narcan by FD. Ems placed IO left tibia

## 2018-07-26 NOTE — Progress Notes (Signed)
ABG attempted 2 times no blood obtained.

## 2018-07-26 NOTE — ED Notes (Signed)
IO removed from left tib. 

## 2018-07-26 NOTE — ED Notes (Signed)
Return of Rosc SR

## 2018-07-26 DEATH — deceased

## 2019-05-12 IMAGING — CT CT CERVICAL SPINE W/O CM
3 of 4 series · 12 of 33 positions shown, 14 images · non-contrast
Comparison: None.

CLINICAL DATA: Trauma with loss of consciousness

EXAM:
CT HEAD WITHOUT CONTRAST
CT CERVICAL SPINE WITHOUT CONTRAST
TECHNIQUE: Multidetector CT imaging of the head and cervical spine was
performed following the standard protocol without intravenous
contrast. Multiplanar CT image reconstructions of the cervical spine
were also generated.

[Series 4: head bone · axial · 0.46mm/px · z∈[-162,-42]mm · 4 of 90 slices shown, 5 images]
[im 20/90  soft-tissue]
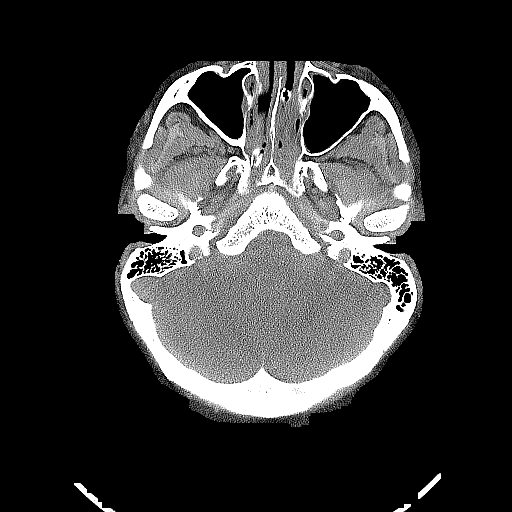
[im 20/90  bone]
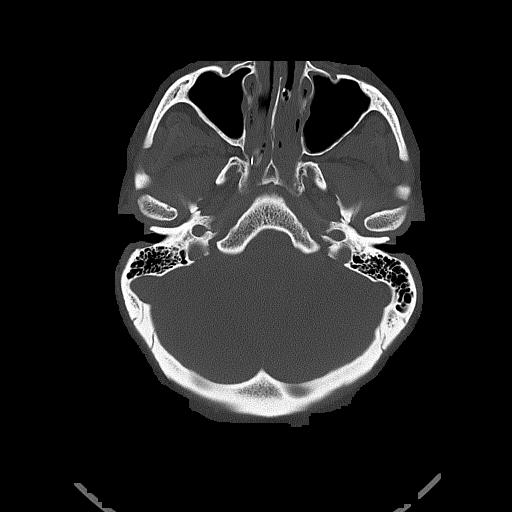
[im 40/90  bone]
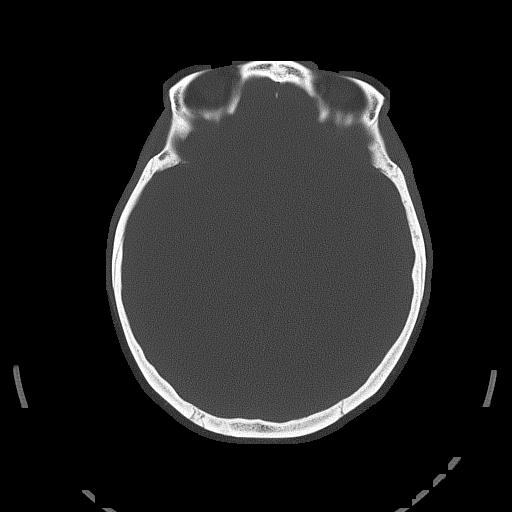
[im 60/90  bone]
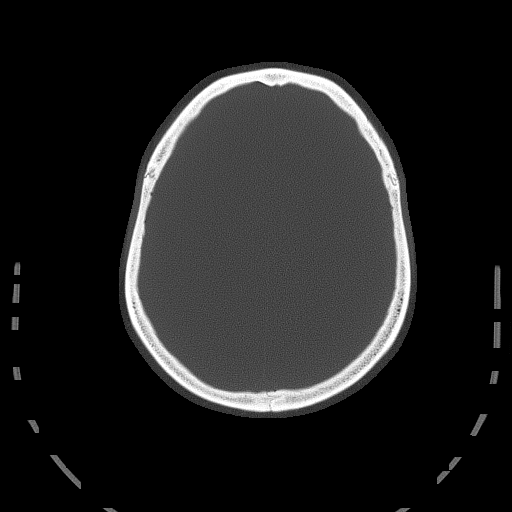
[im 80/90  bone]
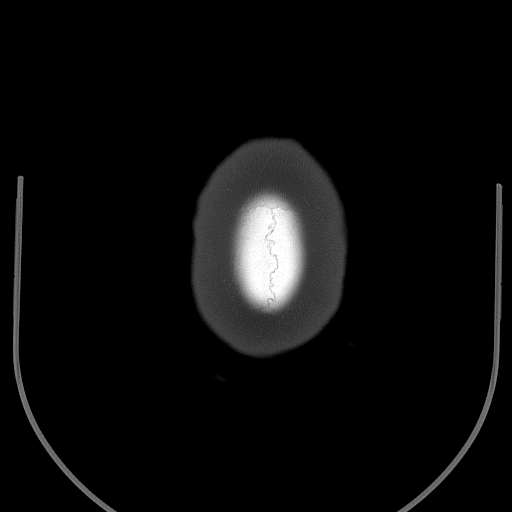

[Series 5: head without cor · coronal · non-contrast · 0.35mm/px · 3 of 78 slices shown]
[im 16/78  bone]
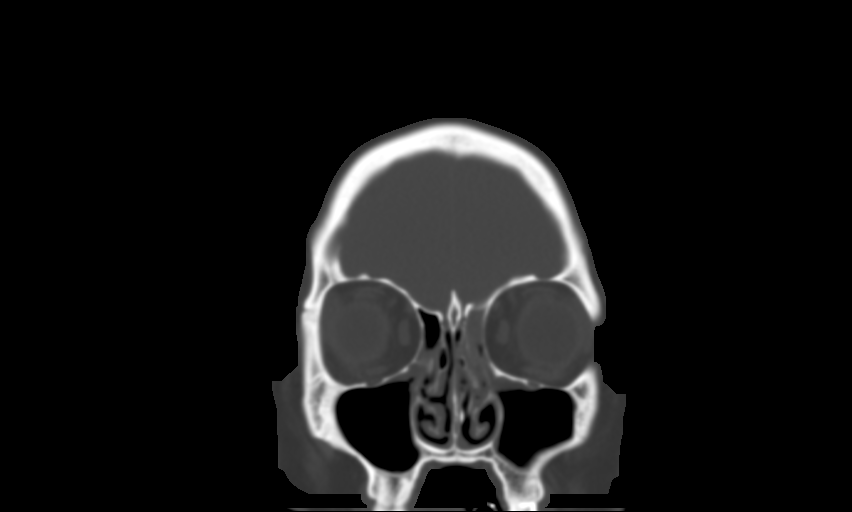
[im 31/78  bone]
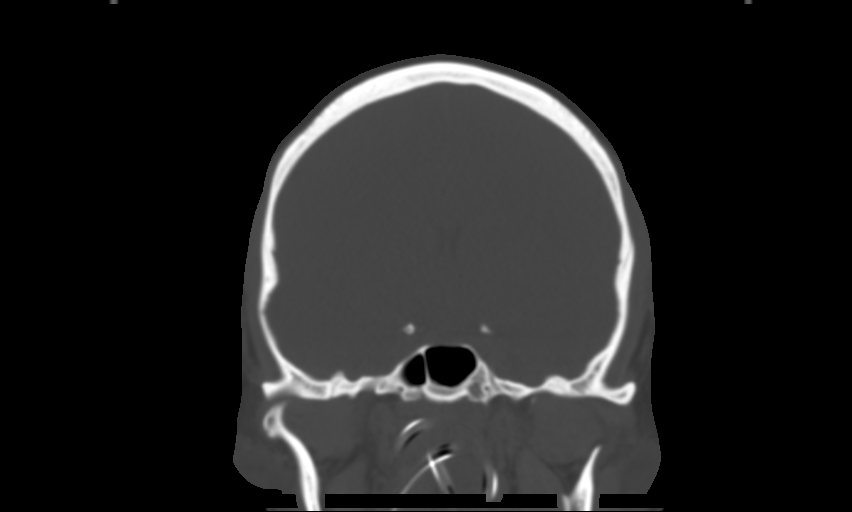
[im 47/78  bone]
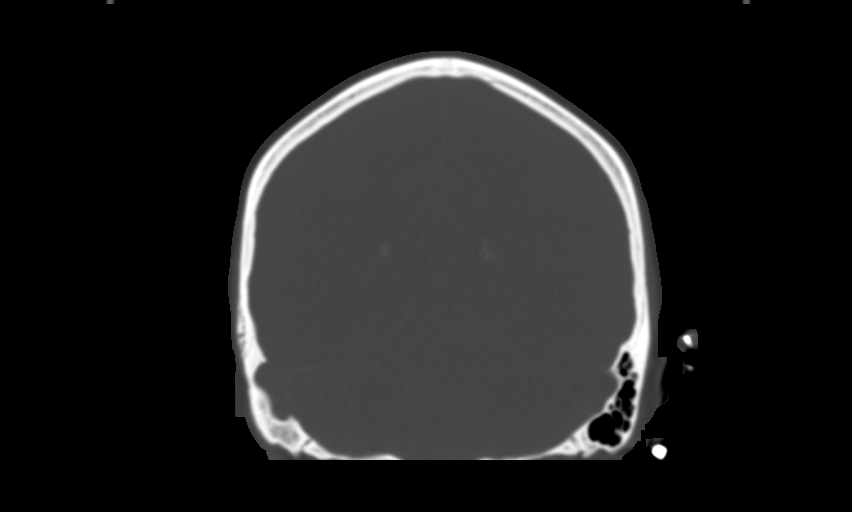

[Series 6: head without sag · sagittal · non-contrast · 0.41mm/px · 5 of 67 slices shown, 6 images]
[im 23/67  bone]
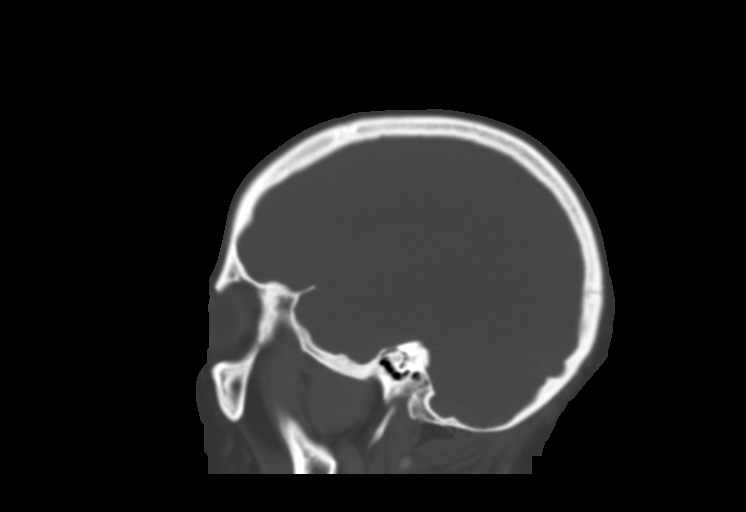
[im 28/67  bone]
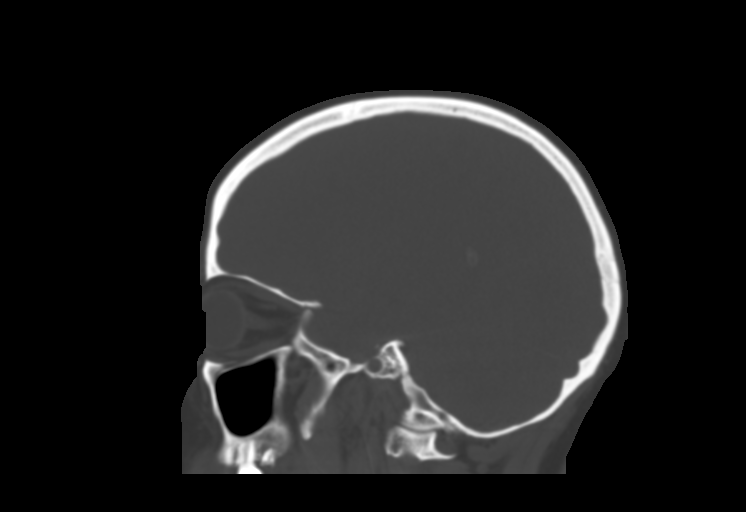
[im 34/67  soft-tissue]
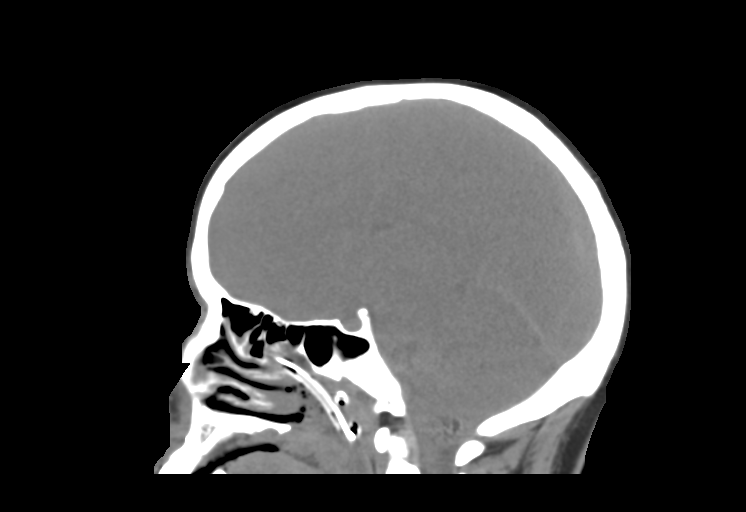
[im 34/67  bone]
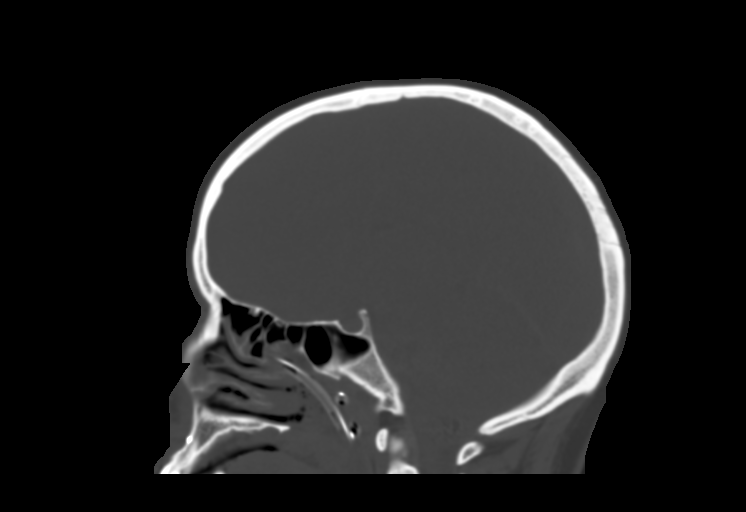
[im 39/67  bone]
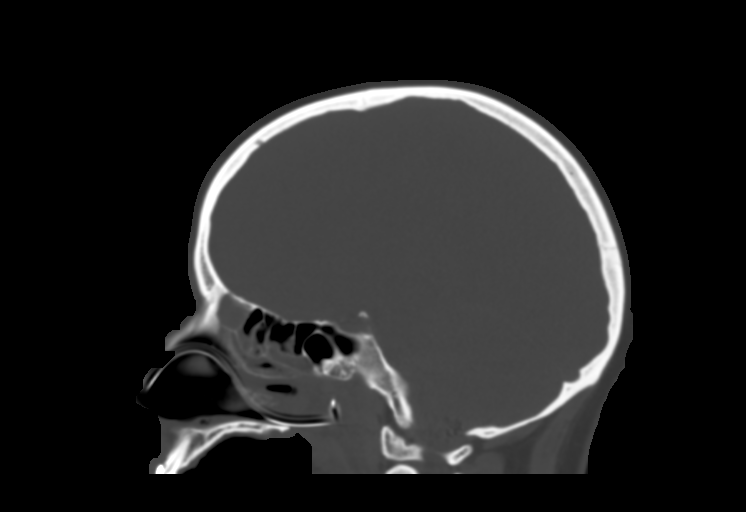
[im 45/67  bone]
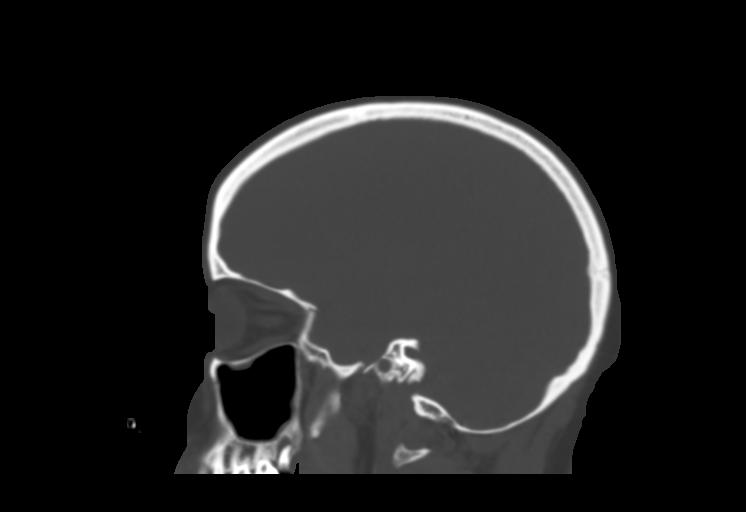

[12 of 33 positions shown; findings below may reference images not displayed]

FINDINGS: CT HEAD FINDINGS

Brain: The lateral ventricles are diminished in size. There is no
appreciable third or fourth ventricle evident. There is loss of
sulci. There is diminution of gray-white differentiation. No
well-defined mass or hemorrhage appreciable. No midline shift or
extra-axial fluid collection.

Vascular: The middle cerebral arteries appear somewhat hyperdense
bilaterally. No vascular calcification evident.

Skull: The bony calvarium appears intact.

Sinuses/Orbits: There is opacification of multiple ethmoid air
cells. Other paranasal sinuses are clear. Frontal sinuses are
essentially aplastic. Orbits appear symmetric bilaterally.

Other: Mastoid air cells are clear.

CT CERVICAL SPINE FINDINGS

Alignment: There is no evidence spondylolisthesis.

Skull base and vertebrae: Skull base and craniocervical junction
regions appear normal. No evident fracture. No blastic or lytic bone
lesions.

Soft tissues and spinal canal: Prevertebral soft tissues and
predental space regions are normal. No paraspinous lesion. No cord
canal hematoma.

Disc levels: Disc spaces appear unremarkable. No nerve root edema or
effacement. No disc extrusion or stenosis.

Upper chest: Patient is intubated. There is diffuse consolidation in
the right upper lobe with patchy infiltrate in the left upper lobe.

Other: None
IMPRESSION: CT head: Findings felt to be indicative of diffuse intracranial
edema with loss of sulci and compression of ventricles. Relative
loss of gray-white differentiation with increase in attenuation of
major vascular structures. Suspect anoxic type brain injury
diffusely. No well-defined mass or hemorrhage. No extra-axial fluid
collection.

There are areas of paranasal sinus disease with patient abated.

CT cervical spine: No fracture or spondylolisthesis. No appreciable
arthropathy. Consolidation in the upper lobes, significantly more
severe on the right than on the left. Patient intubated.

## 2019-05-12 IMAGING — DX DG CHEST 1V PORT
1 series · 1 of 1 positions shown · non-contrast
Comparison: December 14, 2013

CLINICAL DATA: Hypoxia

EXAM:
PORTABLE CHEST 1 VIEW

[chest]
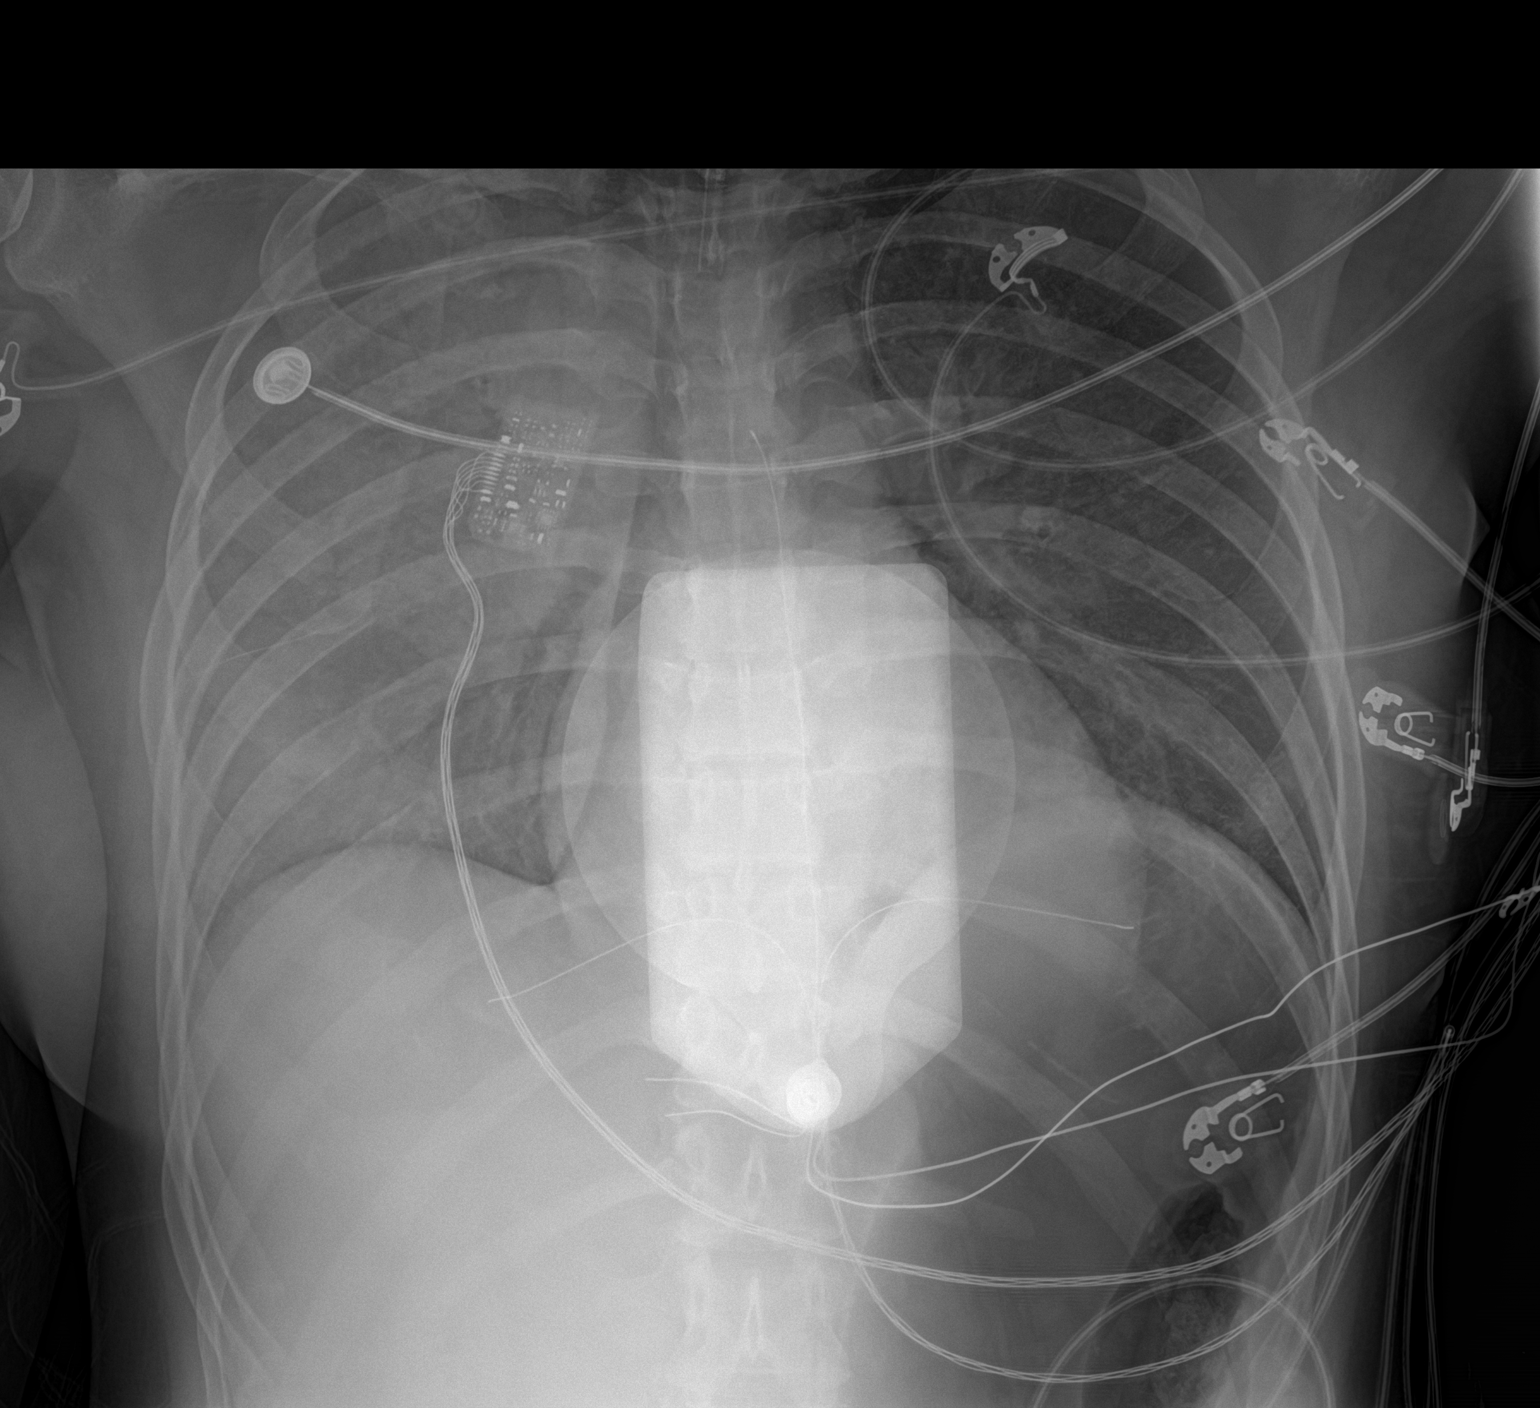

[1 of 1 positions shown; findings below may reference images not displayed]

FINDINGS: Endotracheal tube tip is 4.0 cm above the carina. No pneumothorax.
There is airspace consolidation throughout much of the right lung.
Left lung is clear. Heart size and pulmonary vascularity are normal.
No adenopathy. No bone lesions.
IMPRESSION: Endotracheal tube as described without pneumothorax. Consolidation
throughout much of the right lung. Suspect diffuse pneumonia,
although aspiration could present similarly. Left lung clear. Heart
size within normal limits.
